# Patient Record
Sex: Male | Born: 1965 | Race: White | Hispanic: No | Marital: Married | Smoking: Never smoker
Health system: Southern US, Community
[De-identification: ages and names within clinical notes are randomized; demographics above are authoritative.]

## PROBLEM LIST (undated history)

## (undated) DIAGNOSIS — E7800 Pure hypercholesterolemia, unspecified: Secondary | ICD-10-CM

## (undated) DIAGNOSIS — E119 Type 2 diabetes mellitus without complications: Secondary | ICD-10-CM

## (undated) DIAGNOSIS — I10 Essential (primary) hypertension: Secondary | ICD-10-CM

---

## 1997-09-05 ENCOUNTER — Ambulatory Visit: Admission: RE | Admit: 1997-09-05 | Discharge: 1997-09-05 | Payer: Self-pay

## 2007-01-17 ENCOUNTER — Emergency Department (HOSPITAL_COMMUNITY): Admission: EM | Admit: 2007-01-17 | Discharge: 2007-01-17 | Payer: Self-pay | Admitting: Emergency Medicine

## 2009-05-01 ENCOUNTER — Emergency Department (HOSPITAL_COMMUNITY): Admission: EM | Admit: 2009-05-01 | Discharge: 2009-05-01 | Payer: Self-pay | Admitting: Emergency Medicine

## 2009-05-01 ENCOUNTER — Ambulatory Visit (HOSPITAL_COMMUNITY): Admission: RE | Admit: 2009-05-01 | Discharge: 2009-05-01 | Payer: Self-pay | Admitting: Orthopedic Surgery

## 2010-05-11 LAB — BASIC METABOLIC PANEL
BUN: 13 mg/dL (ref 6–23)
CO2: 27 mEq/L (ref 19–32)
Chloride: 105 mEq/L (ref 96–112)
Creatinine, Ser: 1.09 mg/dL (ref 0.4–1.5)
GFR calc Af Amer: >60 mL/min (ref >60)
GFR calc non Af Amer: >60 mL/min (ref >60)
Glucose, Bld: 133 mg/dL — ABNORMAL HIGH (ref 70–99)

## 2010-05-11 LAB — PROTIME-INR: INR: 0.99 (ref 0.00–1.49)

## 2010-05-11 LAB — DIFFERENTIAL
Basophils Absolute: 0 10*3/uL (ref 0.0–0.1)
Basophils Relative: 0 % (ref 0–1)
Monocytes Absolute: 0.6 10*3/uL (ref 0.1–1.0)
Neutrophils Relative %: 56 % (ref 43–77)

## 2010-05-11 LAB — CBC
Platelets: 291 10*3/uL (ref 150–400)
WBC: 8.1 10*3/uL (ref 4.0–10.5)

## 2010-05-11 LAB — APTT: aPTT: 32 seconds (ref 24–37)

## 2010-11-24 LAB — POCT CARDIAC MARKERS
CKMB, poc: 2
Myoglobin, poc: 107
Troponin i, poc: <0.05

## 2010-11-24 LAB — I-STAT 8, (EC8 V) (CONVERTED LAB)
Acid-Base Excess: 1
Chloride: 105
Glucose, Bld: 100 — ABNORMAL HIGH
Potassium: 3.9
Sodium: 139

## 2010-11-24 LAB — POCT I-STAT CREATININE
Creatinine, Ser: 1.2
Operator id: 234501

## 2014-03-16 ENCOUNTER — Other Ambulatory Visit (HOSPITAL_BASED_OUTPATIENT_CLINIC_OR_DEPARTMENT_OTHER): Payer: Self-pay | Admitting: Family Medicine

## 2014-03-16 ENCOUNTER — Ambulatory Visit (HOSPITAL_BASED_OUTPATIENT_CLINIC_OR_DEPARTMENT_OTHER)
Admission: RE | Admit: 2014-03-16 | Discharge: 2014-03-16 | Disposition: A | Discharge status: Home / Self Care | Payer: BLUE CROSS/BLUE SHIELD | Source: Ambulatory Visit | Attending: Family Medicine | Admitting: Family Medicine

## 2014-03-16 DIAGNOSIS — M79662 Pain in left lower leg: Secondary | ICD-10-CM

## 2014-03-16 DIAGNOSIS — R200 Anesthesia of skin: Secondary | ICD-10-CM | POA: Insufficient documentation

## 2018-03-16 ENCOUNTER — Ambulatory Visit (HOSPITAL_COMMUNITY)
Admission: EM | Admit: 2018-03-16 | Discharge: 2018-03-16 | Disposition: A | Discharge status: Home / Self Care | Payer: Worker's Compensation | Attending: Family Medicine | Admitting: Family Medicine

## 2018-03-16 ENCOUNTER — Ambulatory Visit (INDEPENDENT_AMBULATORY_CARE_PROVIDER_SITE_OTHER): Payer: Worker's Compensation

## 2018-03-16 ENCOUNTER — Encounter (HOSPITAL_COMMUNITY): Payer: Self-pay

## 2018-03-16 DIAGNOSIS — W2209XA Striking against other stationary object, initial encounter: Secondary | ICD-10-CM

## 2018-03-16 DIAGNOSIS — S8991XA Unspecified injury of right lower leg, initial encounter: Secondary | ICD-10-CM | POA: Insufficient documentation

## 2018-03-16 DIAGNOSIS — R600 Localized edema: Secondary | ICD-10-CM | POA: Insufficient documentation

## 2018-03-16 DIAGNOSIS — Z23 Encounter for immunization: Secondary | ICD-10-CM | POA: Diagnosis not present

## 2018-03-16 DIAGNOSIS — M25571 Pain in right ankle and joints of right foot: Secondary | ICD-10-CM

## 2018-03-16 HISTORY — DX: Pure hypercholesterolemia, unspecified: E78.00

## 2018-03-16 HISTORY — DX: Essential (primary) hypertension: I10

## 2018-03-16 HISTORY — DX: Type 2 diabetes mellitus without complications: E11.9

## 2018-03-16 MED ORDER — TETANUS-DIPHTH-ACELL PERTUSSIS 5-2.5-18.5 LF-MCG/0.5 IM SUSP
0.5000 mL | Freq: Once | INTRAMUSCULAR | Status: AC
  Administered 2018-03-16: 0.5 mL via INTRAMUSCULAR

## 2018-03-16 MED ORDER — FUROSEMIDE 40 MG PO TABS: 40.0000 mg | ORAL_TABLET | Freq: Every day | ORAL | 0 refills | Status: AC

## 2018-03-16 MED ORDER — TETANUS-DIPHTH-ACELL PERTUSSIS 5-2.5-18.5 LF-MCG/0.5 IM SUSP
INTRAMUSCULAR | Status: AC
  Filled 2018-03-16: qty 0.5

## 2018-03-16 NOTE — ED Triage Notes (Signed)
Pt states hit his LRE on a bar at work last week, now having rt ankle swelling and bruising

## 2018-03-19 NOTE — ED Provider Notes (Signed)
[facility]   [id number] 03/16/18 Arrival Time: 1636  ASSESSMENT & PLAN:  1. Leg injury, right, initial encounter   2. Lower extremity edema    I have personally viewed the imaging studies ordered this visit.  Imaging: Dg Tibia/fibula Right  Result Date: 03/16/2018 CLINICAL DATA:  Larey SeatFell striking leg on metal rail. EXAM: RIGHT TIBIA AND FIBULA - 2 VIEW COMPARISON:  None. FINDINGS: There is no evidence of fracture or other focal bone lesions. Soft tissue swelling without subcutaneous gas or radiopaque foreign bodies. IMPRESSION: 1. Soft tissue swelling, no acute osseous process. Electronically Signed   By: Awilda Metroourtnay  Bloomer M.D.   On: 03/16/2018 18:35   Meds ordered this encounter  Medications  . Tdap (BOOSTRIX) injection 0.5 mL  . furosemide (LASIX) 40 MG tablet    Sig: Take 1 tablet (40 mg total) by mouth daily.    Dispense:  5 tablet    Refill:  0  Short trial of Lasix to see if this will help relief his LE edema.  I have personally viewed the imaging studies ordered this visit. No fractures seen.  Follow-up Information    Schedule an appointment as soon as possible for a visit  with [facility].   Why:  [phone]:  [phone]         Written occupational health information given. To call first thing in the morning to arrange follow up. Provided with temporary work note listing temporary restrictions until his f/u.  Reviewed expectations re: course of current medical issues. Questions answered. Outlined signs and symptoms indicating need for more acute intervention. Patient verbalized understanding. After Visit Summary given.  SUBJECTIVE: History from: patient. Roger Montes is a 53 y.o. male who reports moderate pain of his right lower anterior leg; described as aching without radiation. Onset: gradual, a week ago. Injury/trama: yes, reports hitting/scraping leg against a metal bar at work about one week ago. Symptoms have progressed to a  point and plateaued since beginning. Aggravating factors: movement. Alleviating factors: none identified. Associated symptoms: none reported. Extremity sensation changes or weakness: none. Self treatment: tried OTCs with relief of pain. History of similar: no. Afebrile. No drainage from area. Has noticed mild to moderate swelling around his R ankle. Ambulatory without assistance.  History reviewed. No pertinent surgical history.   ROS: As per HPI. All other systems negative    OBJECTIVE:  Vitals:   03/16/18 1738 03/16/18 1741  BP:  106/86  Pulse: 63   Resp: 18   Temp: 98.2 F (36.8 C)   TempSrc: Oral   SpO2: 100%     General appearance: alert; no distress Extremities: . RLE: warm and well perfused; poorly localized mild tenderness over right anterior lower leg around healing wound; without gross deformities; with no localized swelling around healing wound; no drainage; with no bruising; ROM: normal CV: brisk extremity capillary refill of RLE; 2+ DP and PT pulse of RLE. Moderate pitting edema of R ankle. Skin: warm and dry; no visible rashes Neurologic: gait normal; normal reflexes of RLE and LLE; normal sensation of RLE and LLE; normal strength of RLE and LLE Psychological: alert and cooperative; normal mood and affect  No Known Allergies  Past Medical History:  Diagnosis Date  . Diabetes mellitus without complication (HCC)   . High cholesterol   . Hypertension    Social History   Socioeconomic History  . Marital status: Married    Spouse name: Not on file  . Number of children: Not on file  .  Years of education: Not on file  . Highest education level: Not on file  Occupational History  . Not on file  Social Needs  . Financial resource strain: Not on file  . Food insecurity:    Worry: Not on file    Inability: Not on file  . Transportation needs:    Medical: Not on file    Non-medical: Not on file  Tobacco Use  . Smoking status: Never Smoker  .  Smokeless tobacco: Never Used  Substance and Sexual Activity  . Alcohol use: Not Currently  . Drug use: Not on file  . Sexual activity: Not on file  Lifestyle  . Physical activity:    Days per week: Not on file    Minutes per session: Not on file  . Stress: Not on file  Relationships  . Social connections:    Talks on phone: Not on file    Gets together: Not on file    Attends religious service: Not on file    Active member of club or organization: Not on file    Attends meetings of clubs or organizations: Not on file    Relationship status: Not on file  Other Topics Concern  . Not on file  Social History Narrative  . Not on file   FH: HTN  History reviewed. No pertinent surgical history.    Mardella LaymanHagler, Jaqlyn Gruenhagen, MD 03/23/18 [phone]

## 2019-09-09 IMAGING — DX DG TIBIA/FIBULA 2V*R*
4 series · 4 of 4 positions shown · non-contrast
Comparison: None.

CLINICAL DATA: Fell striking leg on metal rail.

EXAM:
RIGHT TIBIA AND FIBULA - 2 VIEW

[tibia ap (1 of 2)]
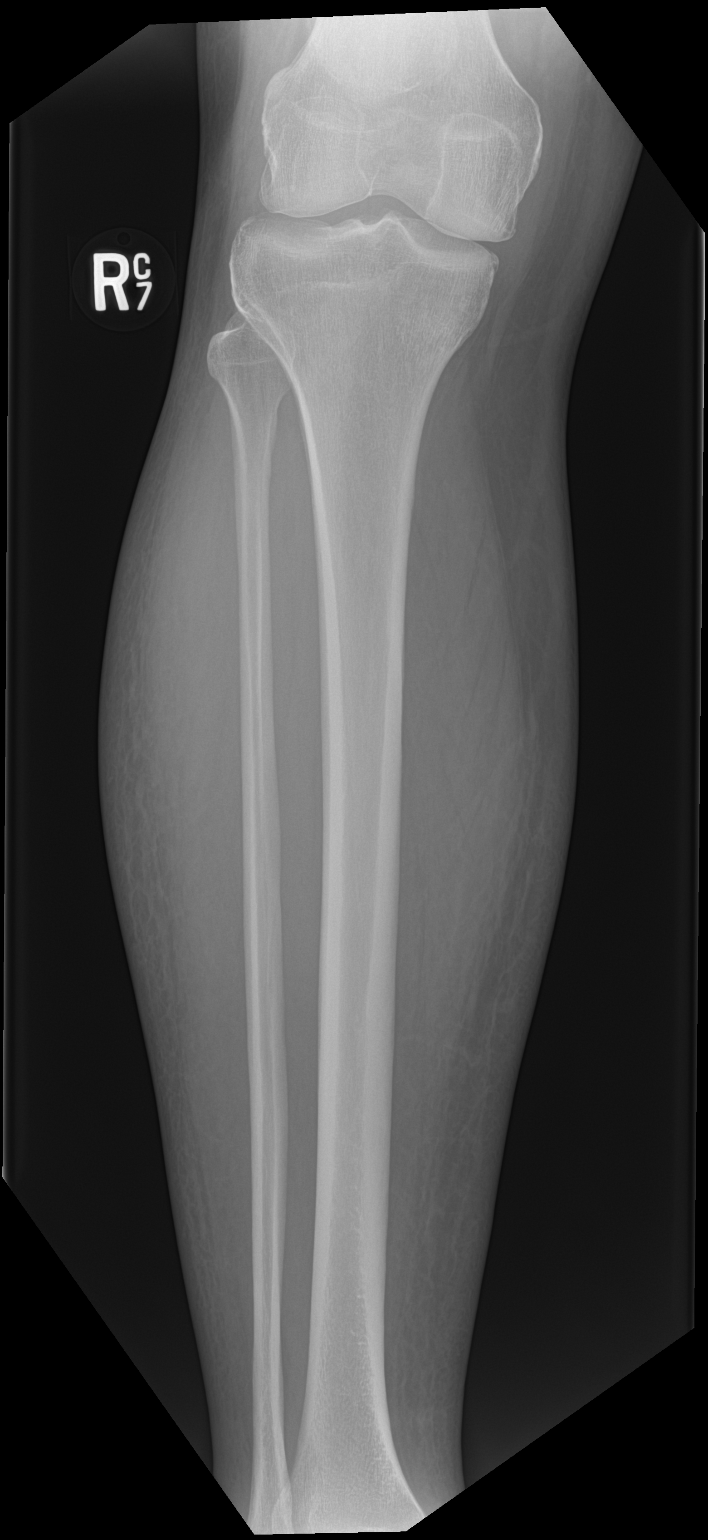

[tibia ap (2 of 2)]
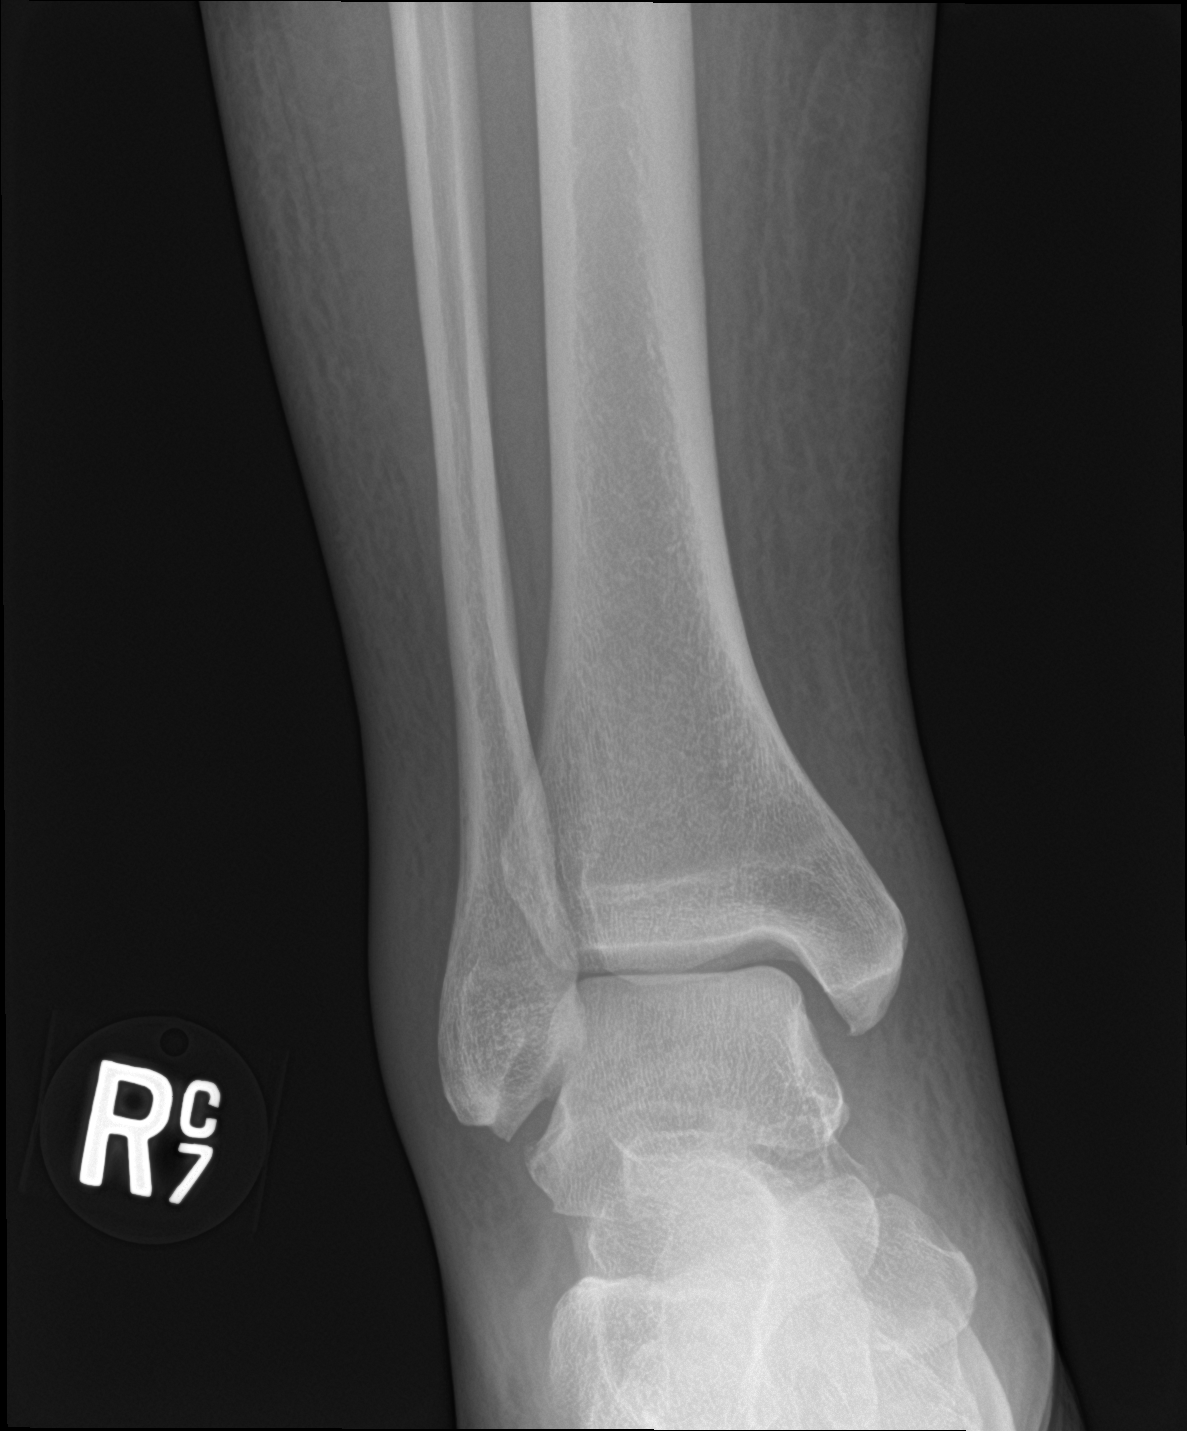

[tibia lat (1 of 2)]
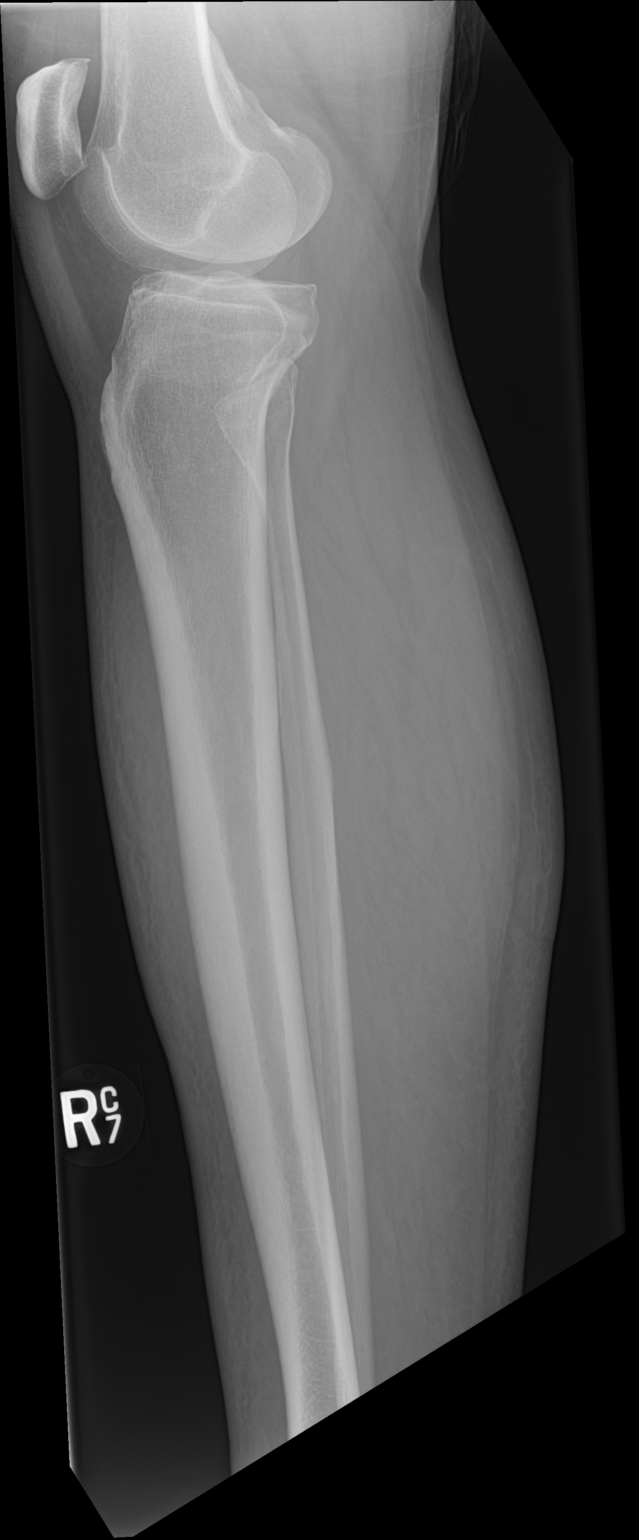

[tibia lat (2 of 2)]
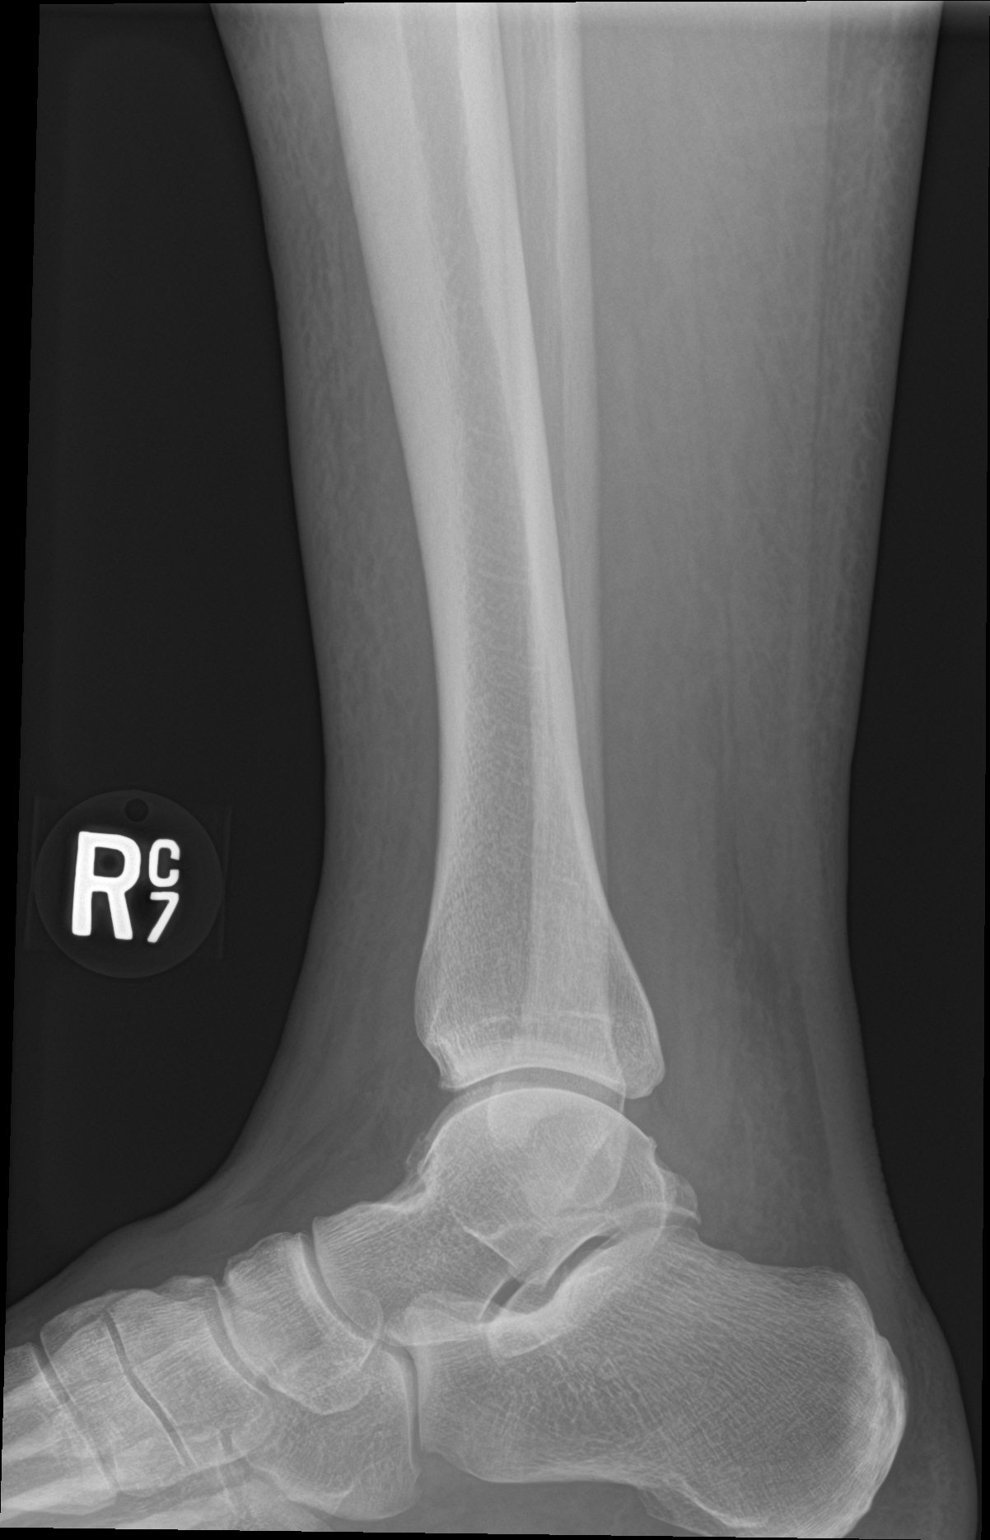

[4 of 4 positions shown; findings below may reference images not displayed]

FINDINGS: There is no evidence of fracture or other focal bone lesions. Soft
tissue swelling without subcutaneous gas or radiopaque foreign
bodies.
IMPRESSION: 1. Soft tissue swelling, no acute osseous process.
